# Patient Record
Sex: Male | Born: 1980 | Race: Black or African American | Hispanic: No | Marital: Married | Smoking: Current every day smoker
Health system: Southern US, Community
[De-identification: ages and names within clinical notes are randomized; demographics above are authoritative.]

## PROBLEM LIST (undated history)

## (undated) HISTORY — PX: APPENDECTOMY: SHX54

## (undated) HISTORY — PX: HERNIA REPAIR: SHX51

---

## 2000-09-18 ENCOUNTER — Emergency Department (HOSPITAL_COMMUNITY): Admission: EM | Admit: 2000-09-18 | Discharge: 2000-09-18 | Payer: Self-pay | Admitting: Emergency Medicine

## 2002-03-09 ENCOUNTER — Emergency Department (HOSPITAL_COMMUNITY): Admission: EM | Admit: 2002-03-09 | Discharge: 2002-03-09 | Payer: Self-pay | Admitting: Emergency Medicine

## 2002-08-15 ENCOUNTER — Emergency Department (HOSPITAL_COMMUNITY): Admission: EM | Admit: 2002-08-15 | Discharge: 2002-08-16 | Payer: Self-pay | Admitting: Emergency Medicine

## 2004-07-26 ENCOUNTER — Emergency Department (HOSPITAL_COMMUNITY): Admission: EM | Admit: 2004-07-26 | Discharge: 2004-07-26 | Payer: Self-pay | Admitting: Family Medicine

## 2004-11-01 ENCOUNTER — Emergency Department (HOSPITAL_COMMUNITY): Admission: EM | Admit: 2004-11-01 | Discharge: 2004-11-01 | Payer: Self-pay | Admitting: Emergency Medicine

## 2005-11-12 ENCOUNTER — Emergency Department (HOSPITAL_COMMUNITY): Admission: EM | Admit: 2005-11-12 | Discharge: 2005-11-12 | Payer: Self-pay | Admitting: *Deleted

## 2006-07-16 ENCOUNTER — Ambulatory Visit: Payer: Self-pay | Admitting: Internal Medicine

## 2006-08-01 ENCOUNTER — Ambulatory Visit (HOSPITAL_BASED_OUTPATIENT_CLINIC_OR_DEPARTMENT_OTHER): Admission: RE | Admit: 2006-08-01 | Discharge: 2006-08-01 | Payer: Self-pay | Admitting: General Surgery

## 2006-08-01 ENCOUNTER — Encounter (INDEPENDENT_AMBULATORY_CARE_PROVIDER_SITE_OTHER): Payer: Self-pay | Admitting: Specialist

## 2007-03-08 DIAGNOSIS — K4090 Unilateral inguinal hernia, without obstruction or gangrene, not specified as recurrent: Secondary | ICD-10-CM | POA: Insufficient documentation

## 2007-11-14 ENCOUNTER — Emergency Department (HOSPITAL_COMMUNITY): Admission: EM | Admit: 2007-11-14 | Discharge: 2007-11-15 | Payer: Self-pay | Admitting: Emergency Medicine

## 2009-09-20 ENCOUNTER — Emergency Department (HOSPITAL_COMMUNITY): Admission: EM | Admit: 2009-09-20 | Discharge: 2009-09-20 | Payer: Self-pay | Admitting: Emergency Medicine

## 2009-10-08 ENCOUNTER — Emergency Department (HOSPITAL_COMMUNITY): Admission: EM | Admit: 2009-10-08 | Discharge: 2009-10-08 | Payer: Self-pay | Admitting: Emergency Medicine

## 2010-06-25 ENCOUNTER — Inpatient Hospital Stay (HOSPITAL_COMMUNITY): Admission: EM | Admit: 2010-06-25 | Discharge: 2010-06-28 | Payer: Self-pay | Source: Home / Self Care

## 2010-06-25 ENCOUNTER — Encounter (INDEPENDENT_AMBULATORY_CARE_PROVIDER_SITE_OTHER): Payer: Self-pay

## 2010-09-05 LAB — DIFFERENTIAL
Basophils Absolute: 0 10*3/uL (ref 0.0–0.1)
Basophils Relative: 0 % (ref 0–1)
Eosinophils Absolute: 0 10*3/uL (ref 0.0–0.7)
Eosinophils Relative: 0 % (ref 0–5)

## 2010-09-05 LAB — URINALYSIS, ROUTINE W REFLEX MICROSCOPIC
Glucose, UA: NEGATIVE mg/dL
Hgb urine dipstick: NEGATIVE
Nitrite: NEGATIVE

## 2010-09-05 LAB — CBC
HCT: 41.3 % (ref 39.0–52.0)
Hemoglobin: 12.9 g/dL — ABNORMAL LOW (ref 13.0–17.0)
Hemoglobin: 13.8 g/dL (ref 13.0–17.0)
Hemoglobin: 14.8 g/dL (ref 13.0–17.0)
MCH: 30 pg (ref 26.0–34.0)
MCH: 30.1 pg (ref 26.0–34.0)
MCHC: 33.4 g/dL (ref 30.0–36.0)
MCV: 89.2 fL (ref 78.0–100.0)
MCV: 89.6 fL (ref 78.0–100.0)
MCV: 90.2 fL (ref 78.0–100.0)
Platelets: 225 10*3/uL (ref 150–400)
Platelets: 228 10*3/uL (ref 150–400)
Platelets: 249 10*3/uL (ref 150–400)
Platelets: 250 10*3/uL (ref 150–400)
RBC: 4.58 MIL/uL (ref 4.22–5.81)
RBC: 4.93 MIL/uL (ref 4.22–5.81)
RDW: 14.2 % (ref 11.5–15.5)
RDW: 14.6 % (ref 11.5–15.5)

## 2010-09-05 LAB — COMPREHENSIVE METABOLIC PANEL
ALT: 26 U/L (ref 0–53)
AST: 26 U/L (ref 0–37)
Albumin: 3.4 g/dL — ABNORMAL LOW (ref 3.5–5.2)
Albumin: 3.9 g/dL (ref 3.5–5.2)
Alkaline Phosphatase: 85 U/L (ref 39–117)
BUN: 7 mg/dL (ref 6–23)
Calcium: 8.6 mg/dL (ref 8.4–10.5)
Chloride: 104 mEq/L (ref 96–112)
Creatinine, Ser: 1.04 mg/dL (ref 0.4–1.5)
GFR calc Af Amer: >60 mL/min (ref >60)
GFR calc non Af Amer: >60 mL/min (ref >60)
GFR calc non Af Amer: >60 mL/min (ref >60)
Total Bilirubin: 0.6 mg/dL (ref 0.3–1.2)
Total Protein: 6.6 g/dL (ref 6.0–8.3)
Total Protein: 7.5 g/dL (ref 6.0–8.3)

## 2010-09-05 LAB — LIPASE, BLOOD: Lipase: 18 U/L (ref 11–59)

## 2010-09-18 LAB — ACETAMINOPHEN LEVEL: Acetaminophen (Tylenol), Serum: <10 ug/mL — ABNORMAL LOW (ref 10–30)

## 2010-11-11 NOTE — Op Note (Signed)
NAME:  Ross Powers, Ross Powers             ACCOUNT NO.:  [id number]   MEDICAL RECORD NO.:  [id number]          PATIENT TYPE:  AMB   LOCATION:  [facility]                         FACILITY:  [facility]   PHYSICIAN:  Anselm Pancoast. Weatherly, M.D.DATE OF BIRTH:  [birth year]   DATE OF PROCEDURE:  08/01/2006  DATE OF DISCHARGE:                               OPERATIVE REPORT   PREOPERATIVE DIAGNOSIS:  Large indirect left inguinal hernia, scrotal.   OPERATION:  Left inguinal herniorrhaphy with removal of very large  segment of omentum.   ANESTHESIA:  General.   SURGEON:  Anselm Pancoast. Zachery Dakins, M.D.   ASSISTANT:  Nurse.   HISTORY:  Ross Powers is a 30 year old Caucasian male who is  referred to [place] by [facility] for a quite large symptomatic left  inguinal hernia.  The patient states that he has had a hernia for 7-8  years or so.  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  The patient does smoke cigarettes.  He has not lost any  weight.  Preoperatively took a laxative to clean his colon.  He has had  difficulty completely reducing the hernia, you can decrease it in size  but not completely reduce it.  Whether it is a slider or whether it is  omentum, I could not tell preoperatively.  His i-STAT was unremarkable,  and he was given 1 gm of Ancef preoperatively.  The patient was taken to  the operative suite.  Induction of general anesthesia.  The left groin  area was first clipped and then prepped with Betadine surgical solution  and draped in a sterile manner.  I anesthetized the anterior of the  iliac crest at the ilioinguinal nerve with about 10 cc of 0.5% Marcaine  with Adrenaline and then as we were repairing the hernia, anesthetized  the other area, it is now total about 25-26  cc of the solution used.  Sharp dissection down through the skin and subcutaneous tissue.  The  external oblique was identified.  There were two large veins that were  divided after clamping and ligating with fine Vicryl.  Then the external  oblique aponeurosis was opened up.  The ilioinguinal nerve was  protected.  First, you could not reduce the actual hernia, so I  dissected so I could get my finger completely around it and then opened  the actual hernia sac, and there was just a wad of omentum that was  enormous that really could not be reduced back into the intraperitoneal  cavity, and I just amputated this with the little pedicles tied with 2-0  Vicryl.  The hernia sac then I could separate it from the cord  structures and after carefully dissecting the hernia sac  circumferentially, a high sac ligation of 0 Surgilon was performed, and  a second tie was placed distal to the first.  I anesthetized this where  the high  sac ligation was and then divided the hernia sac.  The now  enormous hernia sac down into the scrotum was carefully separated, not  injuring the blood supply, vas or testicle.  I left a little bit of the  hernia sac right on the actual testicle, but the most generous large  portion of the sac was removed.  Next, the testicle was kind of pushed  back into its normal anatomic position.  The floor, which had been  completely destroyed because of this large hernia, was closed with a  running 2-0 Prolene, reconstructing the internal ring and going back and  tying the two elements together.  Next, a piece of Prolene mesh shaped  like a sail slit laterally was used to reinforce the floor.  The  inferior limb was sutured to the inguinal ligament with a running 2-0  Prolene, and the two tails were sutured together laterally, reinforcing  the internal ring.  The ilioinguinal nerve was with cord structures.  The iliohypogastric part of the mesh goes over this slightly, but I took   care in suturing with interrupted sutures this superior portion, that it  was not putting direct pressure over this nerve.  Next, the external  oblique was closed with 2-0 Vicryl.  I had put Marcaine where the floor  was reinforced, and I anesthetized the subcutaneous tissue and etc. with  the Marcaine, and then a few 3-0 Vicryl sutures in Scarpa's fascia and a  4-0 Vicryl subcuticular and Benzoin and Steri-Strips in the skin.  The  testicle was in its normal position at the completion of the surgery,  and he will be released after a short stay in the recovery room.  I want  him to be on liquids today.  He will be wearing sort of a jockey-type  underwear to kind of support the scrotum to hopefully not have too much  swelling and be off his feet for 2-3 days.           ______________________________  Anselm Pancoast. Zachery Dakins, M.D.     WJW/MEDQ  D:  08/01/2006  T:  08/01/2006  Job:  [id number]

## 2010-12-19 IMAGING — CR DG NECK SOFT TISSUE
1 series · 1 of 1 positions shown · non-contrast
Comparison: None.

CLINICAL DATA: Ingested foreign body (pork chop bone)

NECK SOFT TISSUES - 1+ VIEW

[w soft tissue neck]
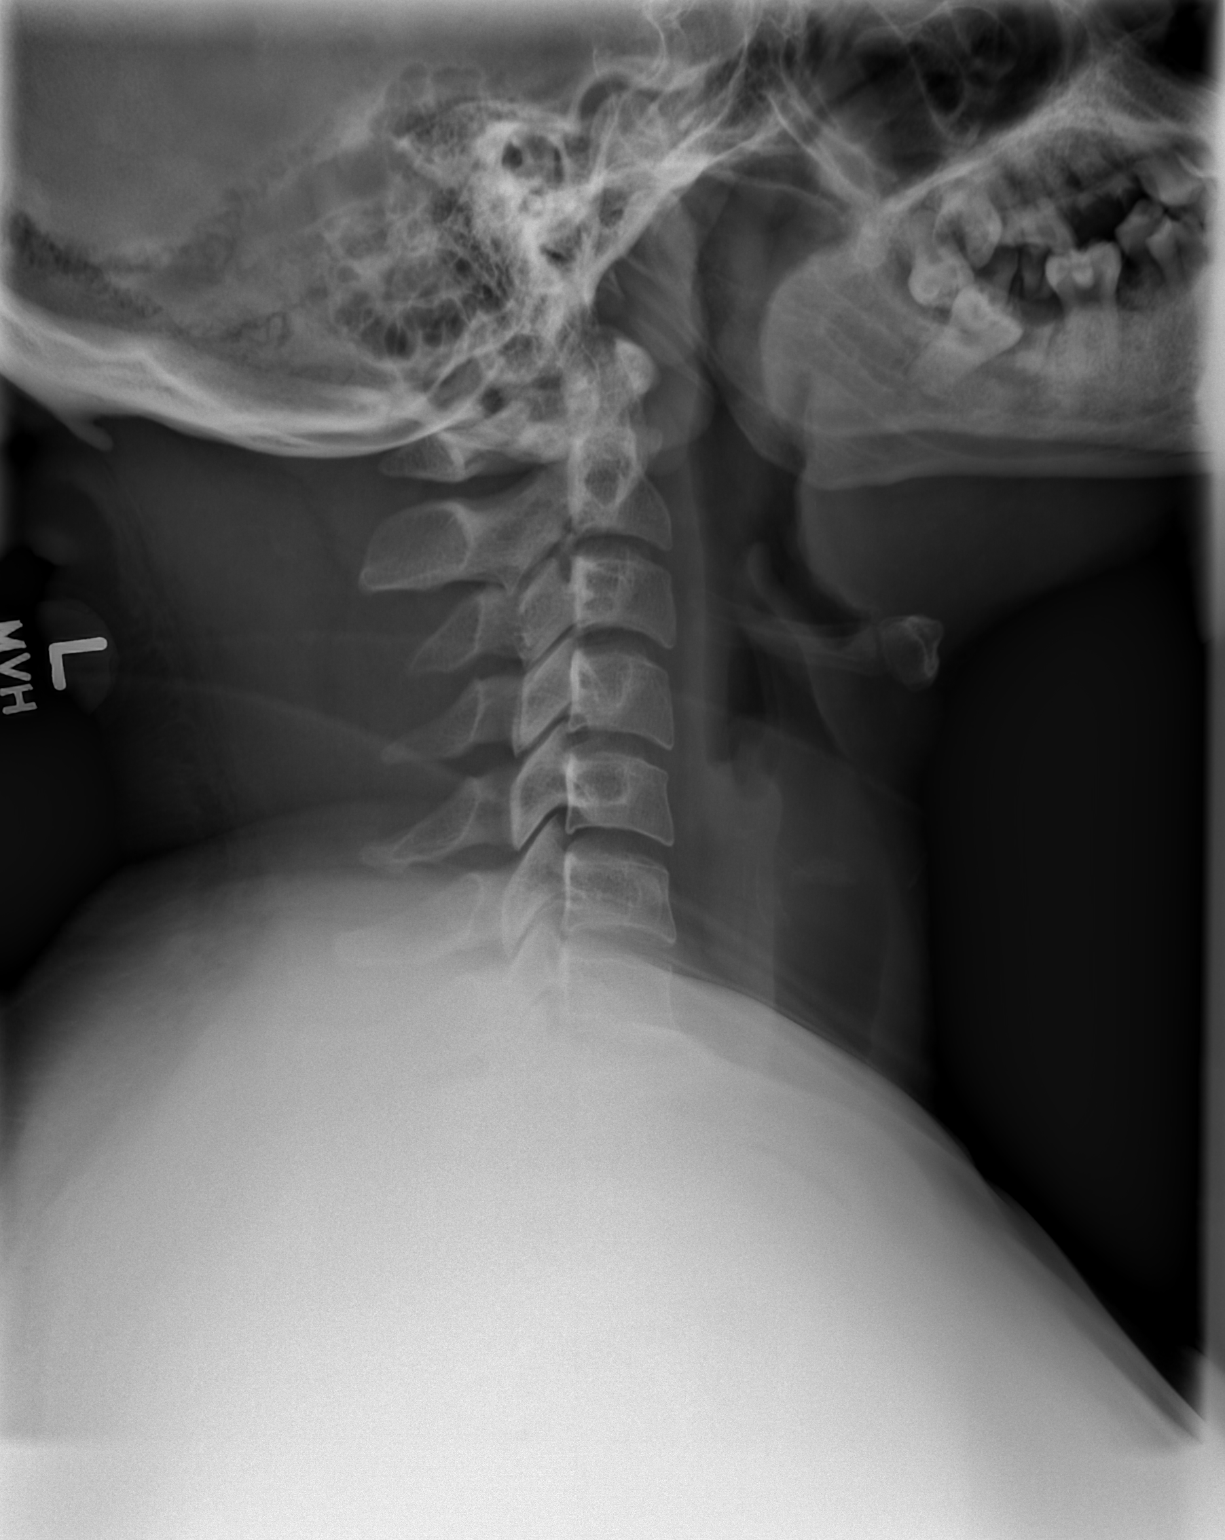

[1 of 1 positions shown; findings below may reference images not displayed]

FINDINGS: No radiopaque foreign body.  Soft tissues are
unremarkable.  The airway appears patent. No precervical soft
tissue widening is present.
IMPRESSION: No radiopaque foreign body.

## 2013-12-11 ENCOUNTER — Encounter (HOSPITAL_COMMUNITY): Payer: Self-pay | Admitting: Emergency Medicine

## 2013-12-11 ENCOUNTER — Inpatient Hospital Stay (HOSPITAL_COMMUNITY)
Admission: EM | Admit: 2013-12-11 | Discharge: 2013-12-14 | DRG: 392 | Disposition: A | Discharge status: Home / Self Care | Payer: Self-pay | Attending: Family Medicine | Admitting: Family Medicine

## 2013-12-11 DIAGNOSIS — F17200 Nicotine dependence, unspecified, uncomplicated: Secondary | ICD-10-CM

## 2013-12-11 DIAGNOSIS — K5732 Diverticulitis of large intestine without perforation or abscess without bleeding: Principal | ICD-10-CM | POA: Diagnosis present

## 2013-12-11 DIAGNOSIS — K5720 Diverticulitis of large intestine with perforation and abscess without bleeding: Secondary | ICD-10-CM

## 2013-12-11 DIAGNOSIS — K5792 Diverticulitis of intestine, part unspecified, without perforation or abscess without bleeding: Secondary | ICD-10-CM

## 2013-12-11 DIAGNOSIS — K4090 Unilateral inguinal hernia, without obstruction or gangrene, not specified as recurrent: Secondary | ICD-10-CM

## 2013-12-11 NOTE — ED Notes (Signed)
Patient is alert and oriented x3.  He is complaining of upper abdominal pain that started yesterday. Currently he rates his pain 9 of 10 with nausea.  He states that the pain is located in the upper  Middle area of his abdomen.

## 2013-12-12 ENCOUNTER — Encounter (HOSPITAL_COMMUNITY): Payer: Self-pay

## 2013-12-12 ENCOUNTER — Emergency Department (HOSPITAL_COMMUNITY): Payer: Self-pay

## 2013-12-12 DIAGNOSIS — K5732 Diverticulitis of large intestine without perforation or abscess without bleeding: Principal | ICD-10-CM

## 2013-12-12 DIAGNOSIS — K631 Perforation of intestine (nontraumatic): Secondary | ICD-10-CM

## 2013-12-12 DIAGNOSIS — F17200 Nicotine dependence, unspecified, uncomplicated: Secondary | ICD-10-CM | POA: Diagnosis present

## 2013-12-12 DIAGNOSIS — K5792 Diverticulitis of intestine, part unspecified, without perforation or abscess without bleeding: Secondary | ICD-10-CM | POA: Diagnosis present

## 2013-12-12 LAB — COMPREHENSIVE METABOLIC PANEL
ALBUMIN: 3.4 g/dL — AB (ref 3.5–5.2)
ALK PHOS: 80 U/L (ref 39–117)
ALT: 31 U/L (ref 0–53)
AST: 29 U/L (ref 0–37)
BILIRUBIN TOTAL: 0.5 mg/dL (ref 0.3–1.2)
BUN: 8 mg/dL (ref 6–23)
CHLORIDE: 100 meq/L (ref 96–112)
CO2: 20 mEq/L (ref 19–32)
CREATININE: 1.3 mg/dL (ref 0.50–1.35)
Calcium: 8.7 mg/dL (ref 8.4–10.5)
GFR calc non Af Amer: 71 mL/min — ABNORMAL LOW (ref >90)
GFR, EST AFRICAN AMERICAN: 82 mL/min — AB (ref >90)
GLUCOSE: 106 mg/dL — AB (ref 70–99)
POTASSIUM: 3.4 meq/L — AB (ref 3.7–5.3)
SODIUM: 134 meq/L — AB (ref 137–147)
Total Protein: 6.7 g/dL (ref 6.0–8.3)

## 2013-12-12 LAB — CBC WITH DIFFERENTIAL/PLATELET
Basophils Absolute: 0 10*3/uL (ref 0.0–0.1)
Basophils Relative: 0 % (ref 0–1)
Eosinophils Absolute: 0 10*3/uL (ref 0.0–0.7)
Eosinophils Relative: 0 % (ref 0–5)
HCT: 43 % (ref 39.0–52.0)
Hemoglobin: 14.7 g/dL (ref 13.0–17.0)
LYMPHS ABS: 0.6 10*3/uL — AB (ref 0.7–4.0)
Lymphocytes Relative: 3 % — ABNORMAL LOW (ref 12–46)
MCH: 29.6 pg (ref 26.0–34.0)
MCHC: 34.2 g/dL (ref 30.0–36.0)
MCV: 86.7 fL (ref 78.0–100.0)
Monocytes Absolute: 0.3 10*3/uL (ref 0.1–1.0)
Monocytes Relative: 2 % — ABNORMAL LOW (ref 3–12)
NEUTROS ABS: 18.7 10*3/uL — AB (ref 1.7–7.7)
NEUTROS PCT: 95 % — AB (ref 43–77)
Platelets: 209 10*3/uL (ref 150–400)
RBC: 4.96 MIL/uL (ref 4.22–5.81)
RDW: 14.8 % (ref 11.5–15.5)
WBC: 19.6 10*3/uL — AB (ref 4.0–10.5)

## 2013-12-12 LAB — LIPASE, BLOOD: LIPASE: 15 U/L (ref 11–59)

## 2013-12-12 MED ORDER — PANTOPRAZOLE SODIUM 40 MG PO TBEC
40.0000 mg | DELAYED_RELEASE_TABLET | Freq: Every day | ORAL | Status: DC
  Administered 2013-12-12 – 2013-12-14 (×3): 40 mg via ORAL
  Filled 2013-12-12 (×3): qty 1

## 2013-12-12 MED ORDER — METRONIDAZOLE IN NACL 5-0.79 MG/ML-% IV SOLN
500.0000 mg | Freq: Once | INTRAVENOUS | Status: AC
  Administered 2013-12-12: 500 mg via INTRAVENOUS
  Filled 2013-12-12: qty 100

## 2013-12-12 MED ORDER — PNEUMOCOCCAL VAC POLYVALENT 25 MCG/0.5ML IJ INJ
0.5000 mL | INJECTION | INTRAMUSCULAR | Status: AC
  Administered 2013-12-13: 0.5 mL via INTRAMUSCULAR
  Filled 2013-12-12 (×2): qty 0.5

## 2013-12-12 MED ORDER — ACETAMINOPHEN 325 MG PO TABS
650.0000 mg | ORAL_TABLET | Freq: Four times a day (QID) | ORAL | Status: DC | PRN
  Administered 2013-12-12: 650 mg via ORAL
  Filled 2013-12-12 (×3): qty 2

## 2013-12-12 MED ORDER — SODIUM CHLORIDE 0.9 % IV SOLN
INTRAVENOUS | Status: DC
  Administered 2013-12-12 – 2013-12-14 (×2): via INTRAVENOUS

## 2013-12-12 MED ORDER — ENOXAPARIN SODIUM 40 MG/0.4ML ~~LOC~~ SOLN
40.0000 mg | SUBCUTANEOUS | Status: DC
  Administered 2013-12-12 – 2013-12-14 (×3): 40 mg via SUBCUTANEOUS
  Filled 2013-12-12 (×3): qty 0.4

## 2013-12-12 MED ORDER — SODIUM CHLORIDE 0.9 % IV SOLN
INTRAVENOUS | Status: DC
  Administered 2013-12-12: 01:00:00 via INTRAVENOUS

## 2013-12-12 MED ORDER — CIPROFLOXACIN IN D5W 400 MG/200ML IV SOLN
400.0000 mg | Freq: Once | INTRAVENOUS | Status: AC
  Administered 2013-12-12: 400 mg via INTRAVENOUS
  Filled 2013-12-12: qty 200

## 2013-12-12 MED ORDER — METRONIDAZOLE IN NACL 5-0.79 MG/ML-% IV SOLN
500.0000 mg | Freq: Three times a day (TID) | INTRAVENOUS | Status: DC
  Administered 2013-12-12 – 2013-12-14 (×6): 500 mg via INTRAVENOUS
  Filled 2013-12-12 (×7): qty 100

## 2013-12-12 MED ORDER — CIPROFLOXACIN IN D5W 400 MG/200ML IV SOLN
400.0000 mg | Freq: Two times a day (BID) | INTRAVENOUS | Status: DC
  Administered 2013-12-12 – 2013-12-14 (×4): 400 mg via INTRAVENOUS
  Filled 2013-12-12 (×5): qty 200

## 2013-12-12 MED ORDER — NICOTINE 7 MG/24HR TD PT24
7.0000 mg | MEDICATED_PATCH | Freq: Every day | TRANSDERMAL | Status: DC
  Administered 2013-12-12 – 2013-12-14 (×3): 7 mg via TRANSDERMAL
  Filled 2013-12-12 (×3): qty 1

## 2013-12-12 MED ORDER — ONDANSETRON HCL 4 MG PO TABS
4.0000 mg | ORAL_TABLET | Freq: Four times a day (QID) | ORAL | Status: DC | PRN
  Administered 2013-12-14: 4 mg via ORAL
  Filled 2013-12-12: qty 1

## 2013-12-12 MED ORDER — CIPROFLOXACIN IN D5W 400 MG/200ML IV SOLN: 400.0000 mg | Freq: Once | INTRAVENOUS | Status: DC

## 2013-12-12 MED ORDER — IOHEXOL 300 MG/ML  SOLN
50.0000 mL | Freq: Once | INTRAMUSCULAR | Status: AC | PRN
  Administered 2013-12-12: 50 mL via ORAL

## 2013-12-12 MED ORDER — METRONIDAZOLE IN NACL 5-0.79 MG/ML-% IV SOLN: 500.0000 mg | Freq: Once | INTRAVENOUS | Status: DC

## 2013-12-12 MED ORDER — ONDANSETRON HCL 4 MG/2ML IJ SOLN
4.0000 mg | Freq: Once | INTRAMUSCULAR | Status: AC
  Administered 2013-12-12: 4 mg via INTRAVENOUS
  Filled 2013-12-12: qty 2

## 2013-12-12 MED ORDER — MORPHINE SULFATE 2 MG/ML IJ SOLN
2.0000 mg | INTRAMUSCULAR | Status: DC | PRN
  Administered 2013-12-12 – 2013-12-14 (×3): 2 mg via INTRAVENOUS
  Filled 2013-12-12 (×3): qty 1

## 2013-12-12 MED ORDER — ONDANSETRON HCL 4 MG/2ML IJ SOLN
4.0000 mg | Freq: Four times a day (QID) | INTRAMUSCULAR | Status: DC | PRN
  Administered 2013-12-12 – 2013-12-14 (×4): 4 mg via INTRAVENOUS
  Filled 2013-12-12 (×4): qty 2

## 2013-12-12 MED ORDER — FENTANYL CITRATE 0.05 MG/ML IJ SOLN
50.0000 ug | Freq: Once | INTRAMUSCULAR | Status: AC
  Administered 2013-12-12: 50 ug via INTRAVENOUS
  Filled 2013-12-12: qty 2

## 2013-12-12 MED ORDER — IOHEXOL 300 MG/ML  SOLN
100.0000 mL | Freq: Once | INTRAMUSCULAR | Status: AC | PRN
  Administered 2013-12-12: 100 mL via INTRAVENOUS

## 2013-12-12 NOTE — H&P (Signed)
[facility] History and Physical  Patient: Ross Powers  ONG:[id number]  DOB: 1981/05/12  DOS: the patient was seen and examined on 12/12/2013 PCP: No primary provider on file.  Chief Complaint: Abdominal pain  HPI: Ross Powers is a [age] y.o. male with Past medical history of smoking cigarettes. Patient presented with complaints of abdominal pain. He mentions the pain started the day before yesterday and yesterday it become severe. The pain was located diffusely in his stomach and was sharp continuous pain. Pain at the time of my evaluation has improved significantly. This was followed by nausea and vomiting. He also had a few episode of diarrhea without any blood. He mentions it first same as his prior appendicites. He denies any alcohol abuse. Denies any similar pain recently. He denies any fever or chills at home other than today. He denies any issues with urination. He denies any chest pain shortness of breath.  The patient is coming from home. And at his baseline independent for most of his ADL.  Review of Systems: as mentioned in the history of present illness.  A Comprehensive review of the other systems is negative.  History reviewed. No pertinent past medical history. Past Surgical History  Procedure Laterality Date  . Hernia repair    . Appendectomy     Social History:  reports that he has been smoking.  He does not have any smokeless tobacco history on file. He reports that he does not drink alcohol. His drug history is not on file.  No Known Allergies  No family history on file.  Prior to Admission medications   Medication Sig Start Date End Date Taking? Authorizing Provider  ranitidine (ZANTAC) 150 MG tablet Take 150 mg by mouth 2 (two) times daily as needed for heartburn.   Yes Historical Provider, MD    Physical Exam: Filed Vitals:   12/11/13 2302 12/12/13 0415  BP: 188/70 104/54  Pulse: 100 86  Temp: 100.2 F (37.9 C) 99.2 F (37.3 C)   TempSrc: Oral Oral  Resp: 16 16  SpO2: 99% 97%    General: Alert, Awake and Oriented to Time, Place and Person. Appear in mild distress Eyes: PERRL ENT: Oral Mucosa clear moist. Neck: no JVD Cardiovascular: S1 and S2 Present, no Murmur, Peripheral Pulses Present Respiratory: Bilateral Air entry equal and Decreased, Clear to Auscultation,  no Crackles,no wheezes Abdomen: Bowel Sound Present, Soft and diffuse mild  tender Skin: no Rash Extremities: no Pedal edema, no calf tenderness Neurologic: Grossly no focal neuro deficit. Labs on Admission:  CBC:  Recent Labs Lab 12/12/13 0004  WBC 19.6*  NEUTROABS 18.7*  HGB 14.7  HCT 43.0  MCV 86.7  PLT 209    CMP     Component Value Date/Time   NA 134* 12/12/2013 0004   K 3.4* 12/12/2013 0004   CL 100 12/12/2013 0004   CO2 20 12/12/2013 0004   GLUCOSE 106* 12/12/2013 0004   BUN 8 12/12/2013 0004   CREATININE 1.30 12/12/2013 0004   CALCIUM 8.7 12/12/2013 0004   PROT 6.7 12/12/2013 0004   ALBUMIN 3.4* 12/12/2013 0004   AST 29 12/12/2013 0004   ALT 31 12/12/2013 0004   ALKPHOS 80 12/12/2013 0004   BILITOT 0.5 12/12/2013 0004   GFRNONAA 71* 12/12/2013 0004   GFRAA 82* 12/12/2013 0004     Recent Labs Lab 12/12/13 0004  LIPASE 15   No results found for this basename: AMMONIA,  in the last 168 hours  No results found  for this basename: CKTOTAL, CKMB, CKMBINDEX, TROPONINI,  in the last 168 hours BNP (last 3 results) No results found for this basename: PROBNP,  in the last 8760 hours  Radiological Exams on Admission: No results found.  Assessment/Plan Principal Problem:   Acute diverticulitis Active Problems:   Smoker   1. Acute diverticulitis Patient presents with complaints of abdominal pain. CT of the abdomen is showing evidence of diverticulitis. It measures no perforation. Patient complains of severe pain at present with nausea and vomiting. And is unable to tolerate by mouth. Patient is admitted to the hospital. Would  continue him with IV fluids. IV antibiotics Cipro and Flagyl. IV morphine for pain. IV Zofran for nausea. Protonix.  2.Smoking. Patient was counseled to quit smoking nicotine patch of ordered.  Consults: General surgery  DVT Prophylaxis: subcutaneous Heparin Nutrition: N.p.o. except medication  Code Status: Full  Family Communication: Significant other  was present at bedside, opportunity was given to ask question and all questions were answered satisfactorily at the time of interview. Disposition: Admitted to inpatient in med-surge unit.  Author: Lynden OxfordPranav Patel, MD [facility] Pager: [phone] 12/12/2013, 6:20 AM    If 7PM-7AM, please contact night-coverage [email or web address] Password [email or web address]

## 2013-12-12 NOTE — ED Notes (Signed)
Attempted to call report to 3W RN, RN cannot take report at this time, said will call back.

## 2013-12-12 NOTE — ED Provider Notes (Signed)
CSN: [id number]     Arrival date & time 12/11/13  2242 History   First MD Initiated Contact with Patient 12/12/13 0023     Chief Complaint  Patient presents with  . Abdominal Pain     (Consider location/radiation/quality/duration/timing/severity/associated sxs/prior Treatment) HPI 33 year old male presents to emergency room with complaint of abdominal pain.  Pain started Wednesday.  Pain is in the epigastrium with some radiation into his back.  Patient has had nausea vomiting and diarrhea.  He reports subjective fevers at home.  Patient status post appendectomy and inguinal hernia repair and there are past.  No sick contacts, no unusual foods, no travel. History reviewed. No pertinent past medical history. Past Surgical History  Procedure Laterality Date  . Hernia repair    . Appendectomy     No family history on file. History  Substance Use Topics  . Smoking status: Current Every Day Smoker  . Smokeless tobacco: Not on file  . Alcohol Use: No    Review of Systems   See History of Present Illness; otherwise all other systems are reviewed and negative  Allergies  Review of patient's allergies indicates no known allergies.  Home Medications   Prior to Admission medications   Medication Sig Start Date End Date Taking? Authorizing Provider  ranitidine (ZANTAC) 150 MG tablet Take 150 mg by mouth 2 (two) times daily as needed for heartburn.   Yes Historical Provider, MD   BP 188/70  Pulse 100  Temp(Src) 100.2 F (37.9 C) (Oral)  Resp 16  SpO2 99% Physical Exam  Nursing note and vitals reviewed. Constitutional: He is oriented to person, place, and time. He appears well-developed and well-nourished. He appears distressed (uncomfortable appearing).  HENT:  Head: Normocephalic and atraumatic.  Right Ear: External ear normal.  Left Ear: External ear normal.  Nose: Nose normal.  Mouth/Throat: Oropharynx is clear and moist.  Eyes: Conjunctivae and EOM are normal. Pupils are  equal, round, and reactive to light.  Neck: Normal range of motion. Neck supple. No JVD present. No tracheal deviation present. No thyromegaly present.  Cardiovascular: Normal rate, regular rhythm, normal heart sounds and intact distal pulses.  Exam reveals no gallop and no friction rub.   No murmur heard. Pulmonary/Chest: Effort normal and breath sounds normal. No stridor. No respiratory distress. He has no wheezes. He has no rales. He exhibits no tenderness.  Abdominal: Soft. Bowel sounds are normal. He exhibits no distension and no mass. There is tenderness (tenderness in epigastrium, also tenderness in lower abdomen and suprapubic and right lower quadrant). There is no rebound and no guarding.  Musculoskeletal: Normal range of motion. He exhibits no edema and no tenderness.  Lymphadenopathy:    He has no cervical adenopathy.  Neurological: He is alert and oriented to person, place, and time. He exhibits normal muscle tone. Coordination normal.  Skin: Skin is warm and dry. No rash noted. No erythema. No pallor.  Psychiatric: He has a normal mood and affect. His behavior is normal. Judgment and thought content normal.    ED Course  Procedures (including critical care time) Labs Review Labs Reviewed  CBC WITH DIFFERENTIAL - Abnormal; Notable for the following:    WBC 19.6 (*)    Neutrophils Relative % 95 (*)    Neutro Abs 18.7 (*)    Lymphocytes Relative 3 (*)    Lymphs Abs 0.6 (*)    Monocytes Relative 2 (*)    All other components within normal limits  COMPREHENSIVE METABOLIC PANEL -  Abnormal; Notable for the following:    Sodium 134 (*)    Potassium 3.4 (*)    Glucose, Bld 106 (*)    Albumin 3.4 (*)    GFR calc non Af Amer 71 (*)    GFR calc Af Amer 82 (*)    All other components within normal limits  LIPASE, BLOOD    Imaging Review No results found.   EKG Interpretation   Date/Time:  Thursday December 11 2013 22:53:12 EDT Ventricular Rate:  103 PR Interval:  146 QRS  Duration: 80 QT Interval:  305 QTC Calculation: 399 R Axis:   29 Text Interpretation:  Sinus tachycardia Consider right atrial enlargement  Baseline wander in lead(s) V2 No acute changes Confirmed by Rhunette CroftNANAVATI, MD,  Janey GentaANKIT [phone]) on 12/11/2013 10:59:14 PM      MDM   Final diagnoses:  Diverticulitis of large intestine with perforation without bleeding    33 year old male with 2 and half days of abdominal pain, nausea vomiting and diarrhea.  Patient has low-grade fever here.  Concern for colitis, enteritis, cholecystitis although for my exam, patient is more tender in the lower abdomen.  Plan for labs, pain and nausea medicine, and CT scan.  4:07 AM There is a delay in getting CT scan read crossed over.  Discussed with the radiologist he reports patient has diverticulitis with thickening of the walls of the sigmoid colon, and slightly increased air in the diverticula than expected, possible micro-perfect.  Patient started on Cipro and Flagyl.  Will discuss with hospitalist for admission.  Olivia Mackielga M Otter, MD 12/12/13 [phone]

## 2013-12-12 NOTE — Progress Notes (Signed)
ANTIBIOTIC CONSULT NOTE - INITIAL  Pharmacy Consult for Cipro Indication: Intra-abdominal infection  No Known Allergies  Patient Measurements: Height: 5\' 6"  (167.6 cm) Weight: 218 lb 1.6 oz (98.93 kg) IBW/kg (Calculated) : 63.8   Vital Signs: Temp: 99.9 F (37.7 C) (06/19 0625) Temp src: Oral (06/19 0625) BP: 134/71 mmHg (06/19 0625) Pulse Rate: 80 (06/19 0625) Intake/Output from previous day:   Intake/Output from this shift:    Labs:  Recent Labs  12/12/13 0004  WBC 19.6*  HGB 14.7  PLT 209  CREATININE 1.30   Estimated Creatinine Clearance: 88.9 ml/min (by C-G formula based on Cr of 1.3). No results found for this basename: VANCOTROUGH, VANCOPEAK, VANCORANDOM, GENTTROUGH, GENTPEAK, GENTRANDOM, TOBRATROUGH, TOBRAPEAK, TOBRARND, AMIKACINPEAK, AMIKACINTROU, AMIKACIN,  in the last 72 hours   Microbiology: No results found for this or any previous visit (from the past 720 hour(s)).  Medical History: History reviewed. No pertinent past medical history.  Medications:  Scheduled:  . enoxaparin (LOVENOX) injection  40 mg Subcutaneous Q24H  . metronidazole  500 mg Intravenous Q8H  . pantoprazole  40 mg Oral Daily   Infusions:  . sodium chloride Stopped (12/12/13 0248)  . sodium chloride     Assessment: 33 yo c/o n/v, diarrhea.  Cipro per Rx and Flagyl per MD for intra-abdominal pain.   Goal of Therapy:  Treat infection  Plan:   Cipro 400mg  IV q12h  F/U SCr/cultures as needed  Ross Powers, Ross Powers 12/12/2013,6:30 AM

## 2013-12-12 NOTE — Care Management Note (Unsigned)
    Page 1 of 1   12/12/2013     2:15:56 PM CARE MANAGEMENT NOTE 12/12/2013  Patient:  Ross Powers,Ross C   Account Number:  [id number]  Date Initiated:  12/12/2013  Documentation initiated by:  [facility] CenterMIRINGU,Promise Bushong  Subjective/Objective Assessment:   33 year old male admitted with diverticulitis.     Action/Plan:   From home.   Anticipated DC Date:  12/15/2013   Anticipated DC Plan:  HOME/SELF CARE      DC Planning Services  CM consult      Choice offered to / List presented to:             Status of service:  In process, will continue to follow Medicare Important Message given?   (If response is "NO", the following Medicare IM given date fields will be blank) Date Medicare IM given:   Date Additional Medicare IM given:    Discharge Disposition:    Per UR Regulation:  Reviewed for med. necessity/level of care/duration of stay  If discussed at Long Length of Stay Meetings, dates discussed:    Comments:

## 2013-12-12 NOTE — Progress Notes (Signed)
Subjective: Patient seen and examined, admitted this morning with acute diverticulitis. See detailed H&P by Dr. Allena KatzPatel. Patient says that he's feeling better Filed Vitals:   12/12/13 0625  BP: 134/71  Pulse: 80  Temp: 99.9 F (37.7 C)  Resp: 16    Chest: Clear Bilaterally Heart : S1S2 RRR Abdomen: Mild tenderness in the left lower quadrant Ext : No edema Neuro: Alert, oriented x 3  A/P Diverticulitis Continue with IV Cipro and Flagyl. Morphine for pain, Zofran for nausea and vomiting.  Tobacco abuse Nicotine patch   Meredeth IdeGagan S Lama [facility] [phone]- [phone]

## 2013-12-13 DIAGNOSIS — F17200 Nicotine dependence, unspecified, uncomplicated: Secondary | ICD-10-CM

## 2013-12-13 LAB — COMPREHENSIVE METABOLIC PANEL
ALT: 37 U/L (ref 0–53)
AST: 36 U/L (ref 0–37)
Albumin: 2.9 g/dL — ABNORMAL LOW (ref 3.5–5.2)
Alkaline Phosphatase: 74 U/L (ref 39–117)
BILIRUBIN TOTAL: 0.4 mg/dL (ref 0.3–1.2)
BUN: 13 mg/dL (ref 6–23)
CALCIUM: 8.3 mg/dL — AB (ref 8.4–10.5)
CO2: 22 mEq/L (ref 19–32)
Chloride: 102 mEq/L (ref 96–112)
Creatinine, Ser: 1.28 mg/dL (ref 0.50–1.35)
GFR, EST AFRICAN AMERICAN: 84 mL/min — AB (ref >90)
GFR, EST NON AFRICAN AMERICAN: 72 mL/min — AB (ref >90)
GLUCOSE: 127 mg/dL — AB (ref 70–99)
Potassium: 3.7 mEq/L (ref 3.7–5.3)
Sodium: 136 mEq/L — ABNORMAL LOW (ref 137–147)
Total Protein: 6 g/dL (ref 6.0–8.3)

## 2013-12-13 LAB — CBC
HCT: 39.8 % (ref 39.0–52.0)
HEMOGLOBIN: 13.9 g/dL (ref 13.0–17.0)
MCH: 30.9 pg (ref 26.0–34.0)
MCHC: 34.9 g/dL (ref 30.0–36.0)
MCV: 88.4 fL (ref 78.0–100.0)
PLATELETS: 175 10*3/uL (ref 150–400)
RBC: 4.5 MIL/uL (ref 4.22–5.81)
RDW: 14.9 % (ref 11.5–15.5)
WBC: 17.3 10*3/uL — ABNORMAL HIGH (ref 4.0–10.5)

## 2013-12-13 LAB — PROTIME-INR
INR: 1.06 (ref 0.00–1.49)
PROTHROMBIN TIME: 13.6 s (ref 11.6–15.2)

## 2013-12-13 NOTE — Progress Notes (Signed)
[facility] PROGRESS NOTE  Ross MeyerWillie C Powers ZOX:[id number] DOB: 07-17-1980 DOA: 12/11/2013 PCP: No primary provider on file.  Assessment/Plan: 1. Acute diverticulitis- CT abdomen showed acute diverticulitis at the mid sigmoid colon, with focal soft tissue inflammation and colonic wall thickening, did not show perforation. Patient started on Cipro and Flagyl, has clinically improved. Pain is much better. Patient is hungry we'll start him on clear liquid diet. If tolerates will advance the diet as tolerated. Continue morphine for pain 2. Nausea vomiting - secondary to above, continue Zofran when necessary 3. Tobacco abuse- continue nicotine patch 4. DVT prophylaxis- Lovenox  Code Status: Full code Family Communication: *Discussed with wife at bedside Disposition Plan: Home when stable   Consultants:  None  Procedures:  None  Antibiotics:  Cipro  Flagyl   HPI/Subjective: Patient seen and examined, admitted with acute sigmoid diverticulitis.  Objective: Filed Vitals:   12/13/13 0503  BP: 132/75  Pulse: 70  Temp: 99.1 F (37.3 C)  Resp: 16    Intake/Output Summary (Last 24 hours) at 12/13/13 1053 Last data filed at 12/13/13 0503  Gross per 24 hour  Intake 1616.66 ml  Output      0 ml  Net 1616.66 ml   Filed Weights   12/12/13 0625  Weight: 98.93 kg (218 lb 1.6 oz)    Exam:  Physical Exam: Head: Normocephalic, atraumatic.        Neck: supple,No deformities, masses, or tenderness noted. Lungs: Normal respiratory effort. B/L Clear to auscultation, no crackles or wheezes.  Heart: Regular RR. S1 and S2 normal  Abdomen: BS normoactive. Soft, Nondistended, Mild tenderness to palpation in the left lower quadrant Extremities: No pretibial edema, no erythema   Data Reviewed: Basic Metabolic Panel:  Recent Labs Lab 12/12/13 0004 12/13/13 0456  NA 134* 136*  K 3.4* 3.7  CL 100 102  CO2 20 22  GLUCOSE 106* 127*  BUN 8 13  CREATININE 1.30 1.28   CALCIUM 8.7 8.3*   Liver Function Tests:  Recent Labs Lab 12/12/13 0004 12/13/13 0456  AST 29 36  ALT 31 37  ALKPHOS 80 74  BILITOT 0.5 0.4  PROT 6.7 6.0  ALBUMIN 3.4* 2.9*    Recent Labs Lab 12/12/13 0004  LIPASE 15   No results found for this basename: AMMONIA,  in the last 168 hours CBC:  Recent Labs Lab 12/12/13 0004 12/13/13 0456  WBC 19.6* 17.3*  NEUTROABS 18.7*  --   HGB 14.7 13.9  HCT 43.0 39.8  MCV 86.7 88.4  PLT 209 175   Cardiac Enzymes: No results found for this basename: CKTOTAL, CKMB, CKMBINDEX, TROPONINI,  in the last 168 hours BNP (last 3 results) No results found for this basename: PROBNP,  in the last 8760 hours CBG: No results found for this basename: GLUCAP,  in the last 168 hours  No results found for this or any previous visit (from the past 240 hour(s)).   Studies: Ct Abdomen Pelvis W Contrast  12/12/2013   CLINICAL DATA:  Upper and mid abdominal pain.  Leukocytosis.  EXAM: CT ABDOMEN AND PELVIS WITH CONTRAST  TECHNIQUE: Multidetector CT imaging of the abdomen and pelvis was performed using the standard protocol following bolus administration of intravenous contrast.  CONTRAST:  100mL OMNIPAQUE IOHEXOL 300 MG/ML  SOLN  COMPARISON:  CT of the abdomen and pelvis performed 06/25/2010  FINDINGS: The visualized lung bases are clear.  The liver and spleen are unremarkable in appearance. The gallbladder is within normal limits. The pancreas  and adrenal glands are unremarkable.  The kidneys are unremarkable in appearance. There is no evidence of hydronephrosis. No renal or ureteral stones are seen. No perinephric stranding is appreciated.  No free fluid is identified. The small bowel is unremarkable in appearance. The stomach is within normal limits. No acute vascular abnormalities are seen.  The patient is status post appendectomy. Minimal scattered diverticulosis is noted along the entirety of the colon.  Focal soft tissue inflammation and colonic wall  thickening is noted at the mid sigmoid colon, with an inflamed diverticulum. Findings are compatible with acute diverticulitis. There is no evidence of significant perforation or abscess formation.  The bladder is mildly distended and grossly unremarkable in appearance. The prostate is normal in size. No inguinal lymphadenopathy is seen.  No acute osseous abnormalities are identified.  IMPRESSION: 1. Acute diverticulitis noted at the mid sigmoid colon, with focal soft tissue inflammation and colonic wall thickening. No evidence of significant perforation or abscess formation. 2. Minimal scattered diverticulosis along the entirety of the colon.   Electronically Signed   By: Roanna RaiderJeffery  Chang M.D.   On: 12/12/2013 03:11    Scheduled Meds: . ciprofloxacin  400 mg Intravenous Q12H  . enoxaparin (LOVENOX) injection  40 mg Subcutaneous Q24H  . metronidazole  500 mg Intravenous Q8H  . nicotine  7 mg Transdermal Daily  . pantoprazole  40 mg Oral Daily   Continuous Infusions: . sodium chloride Stopped (12/12/13 0248)  . sodium chloride 100 mL/hr at 12/12/13 [id number]    Principal Problem:   Acute diverticulitis Active Problems:   Smoker    Time spent: 25 min    [facility], IncAMA,GAGAN S  [facility] Pager [phone] If 7PM-7AM, please contact night-coverage at [email or web address], password [email or web address] 12/13/2013, 10:53 AM  LOS: 2 days

## 2013-12-14 LAB — CBC
HCT: 39.7 % (ref 39.0–52.0)
Hemoglobin: 13.4 g/dL (ref 13.0–17.0)
MCH: 29.2 pg (ref 26.0–34.0)
MCHC: 33.8 g/dL (ref 30.0–36.0)
MCV: 86.5 fL (ref 78.0–100.0)
PLATELETS: 172 10*3/uL (ref 150–400)
RBC: 4.59 MIL/uL (ref 4.22–5.81)
RDW: 14.7 % (ref 11.5–15.5)
WBC: 14.7 10*3/uL — ABNORMAL HIGH (ref 4.0–10.5)

## 2013-12-14 MED ORDER — HYDROCODONE-ACETAMINOPHEN 5-325 MG PO TABS: 1.0000 | ORAL_TABLET | Freq: Four times a day (QID) | ORAL | Status: AC | PRN

## 2013-12-14 MED ORDER — METRONIDAZOLE 500 MG PO TABS: 500.0000 mg | ORAL_TABLET | Freq: Three times a day (TID) | ORAL | Status: AC

## 2013-12-14 MED ORDER — CIPROFLOXACIN HCL 500 MG PO TABS
500.0000 mg | ORAL_TABLET | Freq: Two times a day (BID) | ORAL | Status: DC
  Filled 2013-12-14 (×2): qty 1

## 2013-12-14 MED ORDER — CIPROFLOXACIN HCL 500 MG PO TABS: 500.0000 mg | ORAL_TABLET | Freq: Two times a day (BID) | ORAL | Status: AC

## 2013-12-14 MED ORDER — ONDANSETRON HCL 4 MG PO TABS: 4.0000 mg | ORAL_TABLET | Freq: Three times a day (TID) | ORAL | Status: AC | PRN

## 2013-12-14 MED ORDER — METRONIDAZOLE 500 MG PO TABS
500.0000 mg | ORAL_TABLET | Freq: Three times a day (TID) | ORAL | Status: DC
  Administered 2013-12-14: 500 mg via ORAL
  Filled 2013-12-14 (×3): qty 1

## 2013-12-14 MED ORDER — CIPROFLOXACIN HCL 500 MG PO TABS
500.0000 mg | ORAL_TABLET | Freq: Two times a day (BID) | ORAL | Status: DC
  Filled 2013-12-14 (×3): qty 1

## 2013-12-14 NOTE — Discharge Summary (Signed)
Physician Discharge Summary  Ross Powers ZOX:[id number] DOB: 1980/08/16 DOA: 12/11/2013  PCP: No primary provider on file.  Admit date: 12/11/2013 Discharge date: 12/14/2013  Time spent: 35 minutes  Recommendations for Outpatient Follow-up:  1. Followup PCP in 1 week 2.  Discharge Diagnoses:  Principal Problem:   Acute diverticulitis Active Problems:   Smoker   Discharge Condition: Stable  Diet recommendation: Regular  Filed Weights   12/12/13 0625 12/14/13 0714  Weight: 98.93 kg (218 lb 1.6 oz) 100.29 kg (221 lb 1.6 oz)    History of present illness:  33 y.o. male with Past medical history of smoking cigarettes.  Patient presented with complaints of abdominal pain. He mentions the pain started the day before yesterday and yesterday it become severe. The pain was located diffusely in his stomach and was sharp continuous pain. Pain at the time of my evaluation has improved significantly. This was followed by nausea and vomiting. He also had a few episode of diarrhea without any blood.  He mentions it first same as his prior appendicites.  He denies any alcohol abuse. Denies any similar pain recently. He denies any fever or chills at home other than today. He denies any issues with urination. He denies any chest pain shortness of breath.  The patient is coming from home. And at his baseline independent for most of his ADL.      Hospital Course:  Acute diverticulitis- Improved, wbc is down to 14000, CT abdomen showed acute diverticulitis at the mid sigmoid colon, with focal soft tissue inflammation and colonic wall thickening, did not show perforation. Patient started on Cipro and Flagyl, has clinically improved. Pain is much better. Patient is hungry tolerated  regular diet I Will discharge home on Cipro and Flagyl. Vicodin when necessary for pain  Nausea vomiting - secondary to above, continue Zofran when necessary      Procedures:  None  Consultations:  None  Discharge Exam: Filed Vitals:   12/14/13 1421  BP: 137/85  Pulse: 58  Temp: 98.5 F (36.9 C)  Resp: 16      Discharge Instructions You were cared for by a hospitalist during your hospital stay. If you have any questions about your discharge medications or the care you received while you were in the hospital after you are discharged, you can call the unit and asked to speak with the hospitalist on call if the hospitalist that took care of you is not available. Once you are discharged, your primary care physician will handle any further medical issues. Please note that NO REFILLS for any discharge medications will be authorized once you are discharged, as it is imperative that you return to your primary care physician (or establish a relationship with a primary care physician if you do not have one) for your aftercare needs so that they can reassess your need for medications and monitor your lab values.  Discharge Instructions   Diet - low sodium heart healthy    Complete by:  As directed      Increase activity slowly    Complete by:  As directed             Medication List         ciprofloxacin 500 MG tablet  Commonly known as:  CIPRO  Take 1 tablet (500 mg total) by mouth 2 (two) times daily.     HYDROcodone-acetaminophen 5-325 MG per tablet  Commonly known as:  LORTAB  Take 1 tablet by mouth every 6 (six)  hours as needed for moderate pain.     metroNIDAZOLE 500 MG tablet  Commonly known as:  FLAGYL  Take 1 tablet (500 mg total) by mouth every 8 (eight) hours.     ZANTAC 150 MG tablet  Generic drug:  ranitidine  Take 150 mg by mouth 2 (two) times daily as needed for heartburn.       No Known Allergies    The results of significant diagnostics from this hospitalization (including imaging, microbiology, ancillary and laboratory) are listed below for reference.    Significant Diagnostic Studies: Ct Abdomen  Pelvis W Contrast  12/12/2013   CLINICAL DATA:  Upper and mid abdominal pain.  Leukocytosis.  EXAM: CT ABDOMEN AND PELVIS WITH CONTRAST  TECHNIQUE: Multidetector CT imaging of the abdomen and pelvis was performed using the standard protocol following bolus administration of intravenous contrast.  CONTRAST:  100mL OMNIPAQUE IOHEXOL 300 MG/ML  SOLN  COMPARISON:  CT of the abdomen and pelvis performed 06/25/2010  FINDINGS: The visualized lung bases are clear.  The liver and spleen are unremarkable in appearance. The gallbladder is within normal limits. The pancreas and adrenal glands are unremarkable.  The kidneys are unremarkable in appearance. There is no evidence of hydronephrosis. No renal or ureteral stones are seen. No perinephric stranding is appreciated.  No free fluid is identified. The small bowel is unremarkable in appearance. The stomach is within normal limits. No acute vascular abnormalities are seen.  The patient is status post appendectomy. Minimal scattered diverticulosis is noted along the entirety of the colon.  Focal soft tissue inflammation and colonic wall thickening is noted at the mid sigmoid colon, with an inflamed diverticulum. Findings are compatible with acute diverticulitis. There is no evidence of significant perforation or abscess formation.  The bladder is mildly distended and grossly unremarkable in appearance. The prostate is normal in size. No inguinal lymphadenopathy is seen.  No acute osseous abnormalities are identified.  IMPRESSION: 1. Acute diverticulitis noted at the mid sigmoid colon, with focal soft tissue inflammation and colonic wall thickening. No evidence of significant perforation or abscess formation. 2. Minimal scattered diverticulosis along the entirety of the colon.   Electronically Signed   By: Roanna RaiderJeffery  Chang M.D.   On: 12/12/2013 03:11    Microbiology: No results found for this or any previous visit (from the past 240 hour(s)).   Labs: Basic Metabolic  Panel:  Recent Labs Lab 12/12/13 0004 12/13/13 0456  NA 134* 136*  K 3.4* 3.7  CL 100 102  CO2 20 22  GLUCOSE 106* 127*  BUN 8 13  CREATININE 1.30 1.28  CALCIUM 8.7 8.3*   Liver Function Tests:  Recent Labs Lab 12/12/13 0004 12/13/13 0456  AST 29 36  ALT 31 37  ALKPHOS 80 74  BILITOT 0.5 0.4  PROT 6.7 6.0  ALBUMIN 3.4* 2.9*    Recent Labs Lab 12/12/13 0004  LIPASE 15   No results found for this basename: AMMONIA,  in the last 168 hours CBC:  Recent Labs Lab 12/12/13 0004 12/13/13 0456 12/14/13 0446  WBC 19.6* 17.3* 14.7*  NEUTROABS 18.7*  --   --   HGB 14.7 13.9 13.4  HCT 43.0 39.8 39.7  MCV 86.7 88.4 86.5  PLT 209 175 172   Cardiac Enzymes: No results found for this basename: CKTOTAL, CKMB, CKMBINDEX, TROPONINI,  in the last 168 hours BNP: BNP (last 3 results) No results found for this basename: PROBNP,  in the last 8760 hours CBG: No results found  for this basename: GLUCAP,  in the last 168 hours     Signed:  LAMA,GAGAN S  [facility] 12/14/2013, 4:23 PM

## 2013-12-14 NOTE — Progress Notes (Signed)
Patient discharged home, all discharge medications and instructions reviewed and questions answered. Patient to be assisted to vehicle by wheelchair.  

## 2013-12-14 NOTE — Progress Notes (Signed)
[facility] PROGRESS NOTE  Ross MeyerWillie C Powers ZOX:[id number] DOB: Oct 28, 1980 DOA: 12/11/2013 PCP: No primary provider on file.  Assessment/Plan: 1. Acute diverticulitis-  Improved, wbc is down to 14000, CT abdomen showed acute diverticulitis at the mid sigmoid colon, with focal soft tissue inflammation and colonic wall thickening, did not show perforation. Patient started on Cipro and Flagyl, has clinically improved. Pain is much better. Patient is hungry we'll start him on regular diet If tolerates will discharge home. 2. Nausea vomiting - secondary to above, continue Zofran when necessary 3. Tobacco abuse- continue nicotine patch 4. DVT prophylaxis- Lovenox  Code Status: Full code Family Communication: *Discussed with wife at bedside Disposition Plan: Home when stable   Consultants:  None  Procedures:  None  Antibiotics:  Cipro  Flagyl   HPI/Subjective: Patient seen and examined, admitted with acute sigmoid diverticulitis. Had one episode of vomiting last night. Wants to try regular food.  Objective: Filed Vitals:   12/14/13 0525  BP: 128/84  Pulse: 52  Temp: 98.5 F (36.9 C)  Resp: 16    Intake/Output Summary (Last 24 hours) at 12/14/13 1148 Last data filed at 12/14/13 [id number]  Gross per 24 hour  Intake 5026.66 ml  Output      0 ml  Net 5026.66 ml   Filed Weights   12/12/13 0625 12/14/13 0714  Weight: 98.93 kg (218 lb 1.6 oz) 100.29 kg (221 lb 1.6 oz)    Exam:  Physical Exam: Head: Normocephalic, atraumatic.        Neck: supple,No deformities, masses, or tenderness noted. Lungs: Normal respiratory effort. B/L Clear to auscultation, no crackles or wheezes.  Heart: Regular RR. S1 and S2 normal  Abdomen: BS normoactive. Soft, Nondistended, Mild tenderness to palpation in the left lower quadrant Extremities: No pretibial edema, no erythema   Data Reviewed: Basic Metabolic Panel:  Recent Labs Lab 12/12/13 0004 12/13/13 0456  NA 134* 136*  K  3.4* 3.7  CL 100 102  CO2 20 22  GLUCOSE 106* 127*  BUN 8 13  CREATININE 1.30 1.28  CALCIUM 8.7 8.3*   Liver Function Tests:  Recent Labs Lab 12/12/13 0004 12/13/13 0456  AST 29 36  ALT 31 37  ALKPHOS 80 74  BILITOT 0.5 0.4  PROT 6.7 6.0  ALBUMIN 3.4* 2.9*    Recent Labs Lab 12/12/13 0004  LIPASE 15   No results found for this basename: AMMONIA,  in the last 168 hours CBC:  Recent Labs Lab 12/12/13 0004 12/13/13 0456 12/14/13 0446  WBC 19.6* 17.3* 14.7*  NEUTROABS 18.7*  --   --   HGB 14.7 13.9 13.4  HCT 43.0 39.8 39.7  MCV 86.7 88.4 86.5  PLT 209 175 172   Cardiac Enzymes: No results found for this basename: CKTOTAL, CKMB, CKMBINDEX, TROPONINI,  in the last 168 hours BNP (last 3 results) No results found for this basename: PROBNP,  in the last 8760 hours CBG: No results found for this basename: GLUCAP,  in the last 168 hours  No results found for this or any previous visit (from the past 240 hour(s)).   Studies: No results found.  Scheduled Meds: . ciprofloxacin  500 mg Oral BID  . enoxaparin (LOVENOX) injection  40 mg Subcutaneous Q24H  . metroNIDAZOLE  500 mg Oral 3 times per day  . nicotine  7 mg Transdermal Daily  . pantoprazole  40 mg Oral Daily   Continuous Infusions: . sodium chloride 100 mL/hr at 12/14/13 [id number]    Principal  Problem:   Acute diverticulitis Active Problems:   Smoker    Time spent: 25 min    [facility] AntonioAMA,GAGAN S  [facility] Pager [phone] If 7PM-7AM, please contact night-coverage at [email or web address], password [email or web address] 12/14/2013, 11:48 AM  LOS: 3 days

## 2015-02-22 IMAGING — CT CT ABD-PELV W/ CM
1 of 5 series · 12 of 32 positions shown, 18 images · IV contrast (100 ML OMNI 300)
Comparison: CT of the abdomen and pelvis performed 06/25/2010

CLINICAL DATA: Upper and mid abdominal pain.  Leukocytosis.

EXAM:
CT ABDOMEN AND PELVIS WITH CONTRAST
TECHNIQUE: Multidetector CT imaging of the abdomen and pelvis was performed
using the standard protocol following bolus administration of
intravenous contrast.
CONTRAST:  100mL OMNIPAQUE IOHEXOL 300 MG/ML  SOLN

[Series 3: abd/pel with · axial · 0.78mm/px · z∈[+1264,+1644]mm · 12 of 90 slices shown, 18 images]
[im 7/90  soft-tissue]
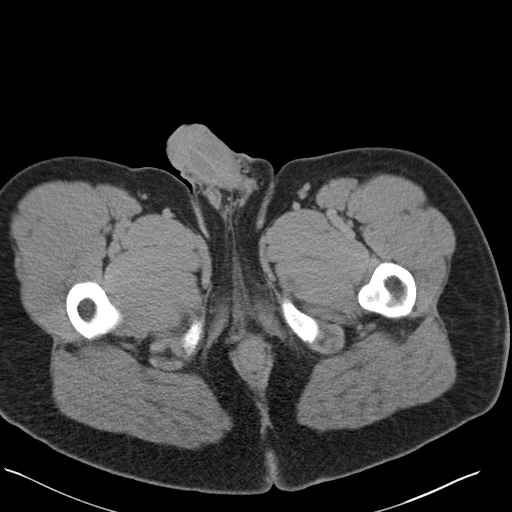
[im 7/90  bone]
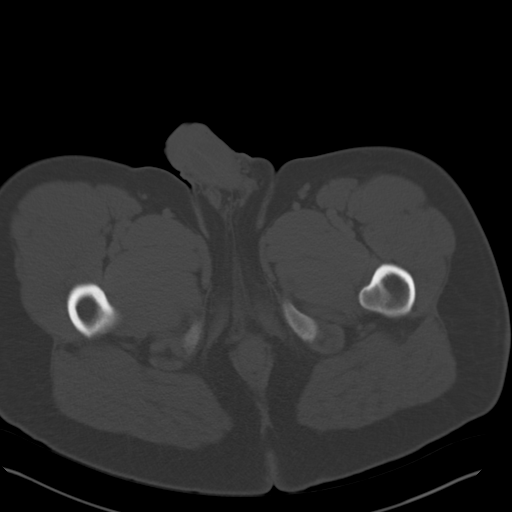
[im 14/90  soft-tissue]
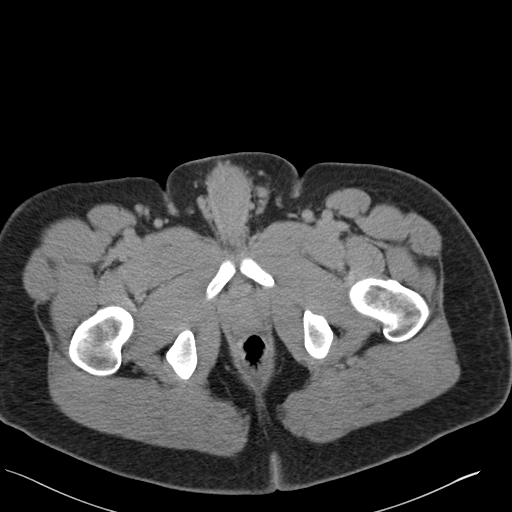
[im 21/90  soft-tissue]
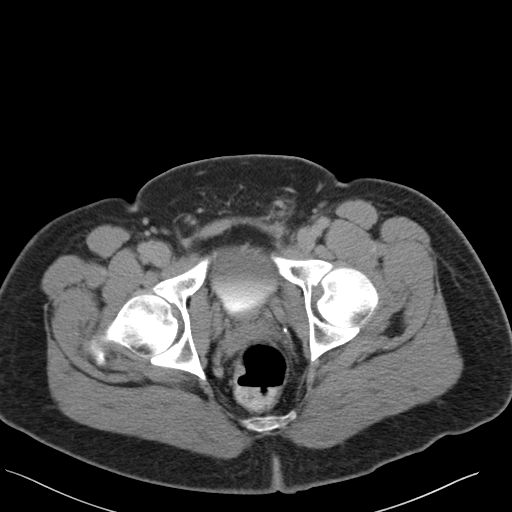
[im 28/90  soft-tissue]
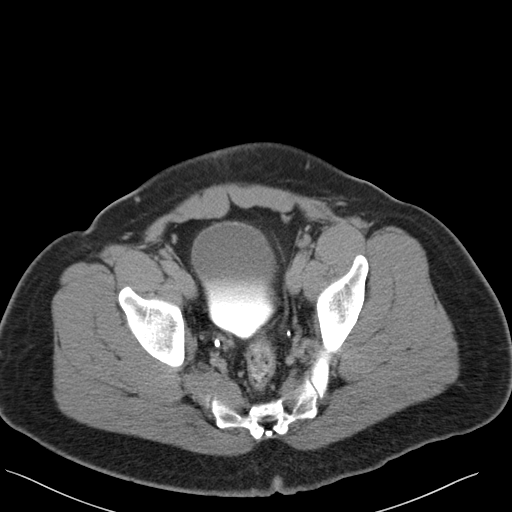
[im 35/90  soft-tissue]
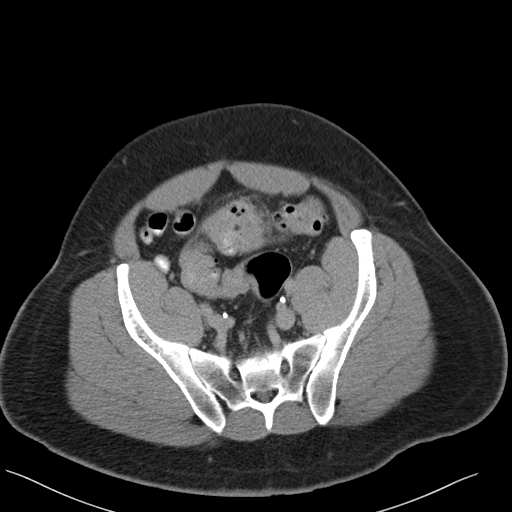
[im 42/90  soft-tissue]
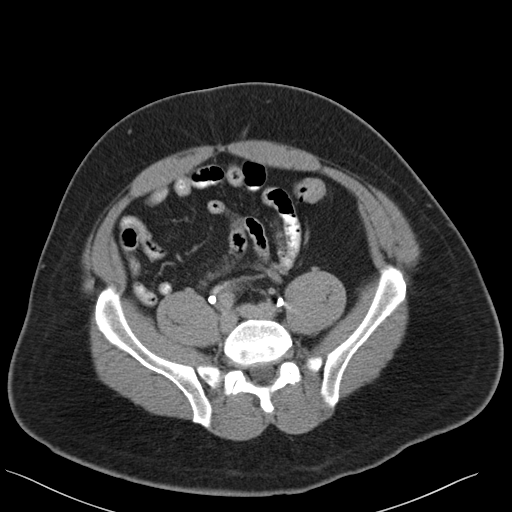
[im 48/90  soft-tissue]
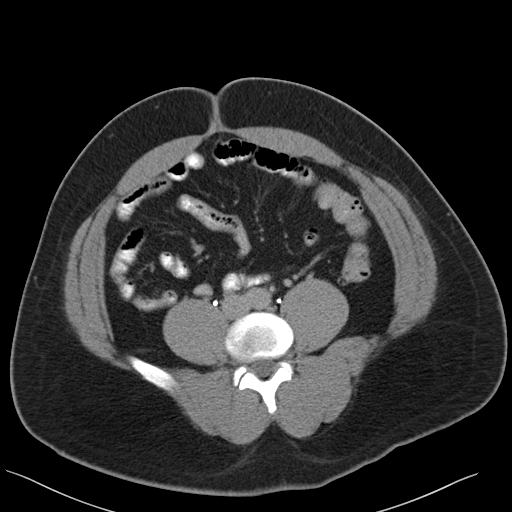
[im 55/90  soft-tissue]
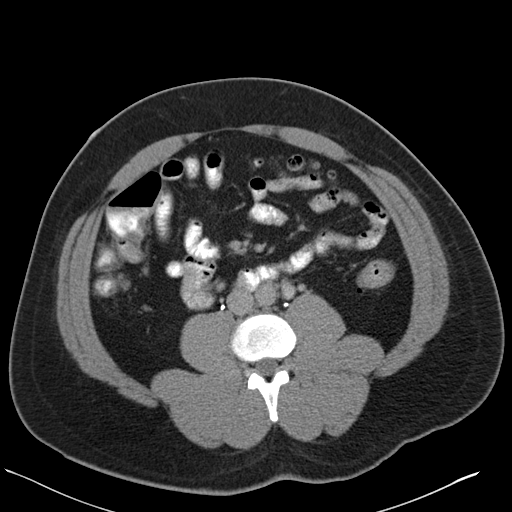
[im 62/90  soft-tissue]
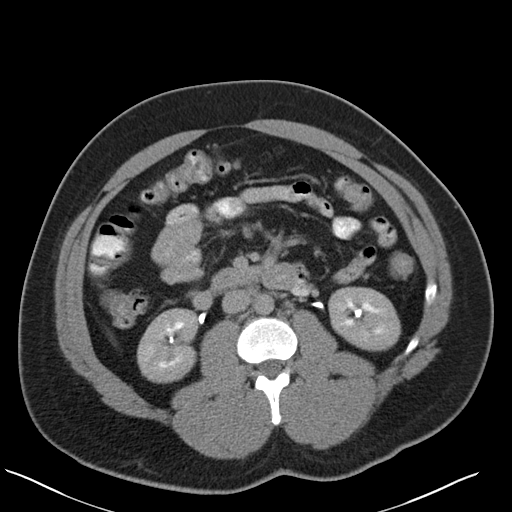
[im 62/90  lung]
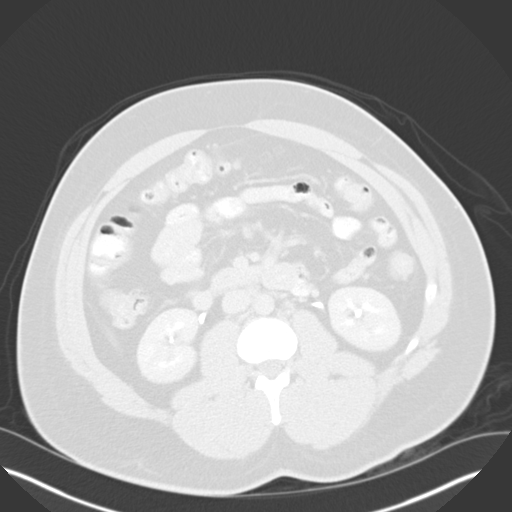
[im 62/90  bone]
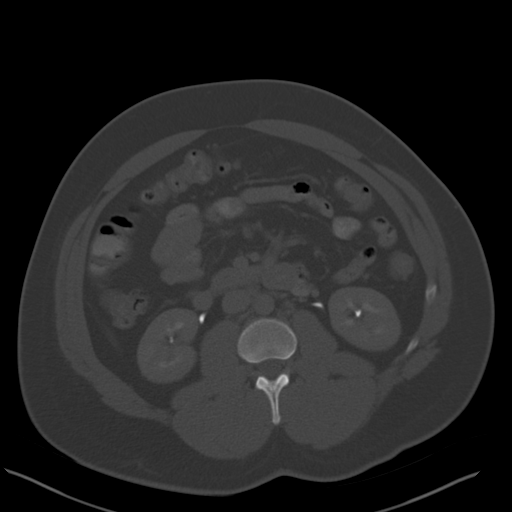
[im 69/90  soft-tissue]
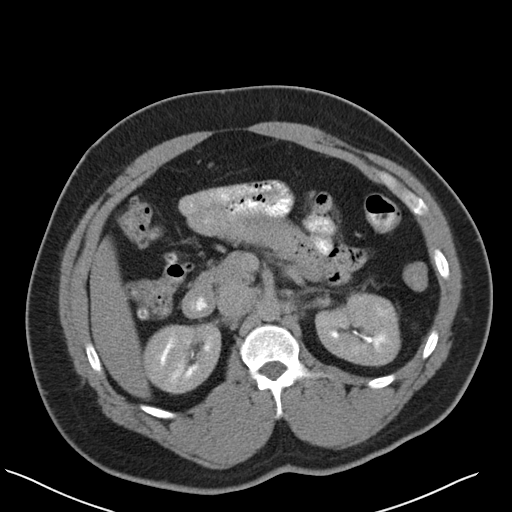
[im 69/90  lung]
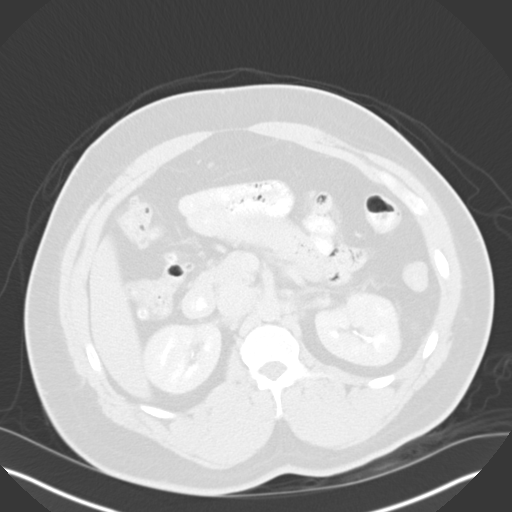
[im 76/90  soft-tissue]
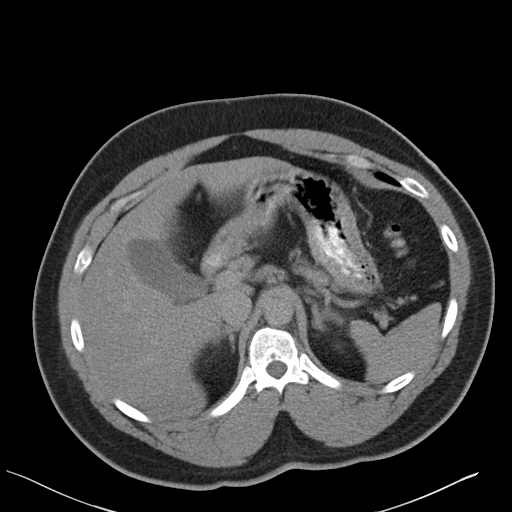
[im 76/90  lung]
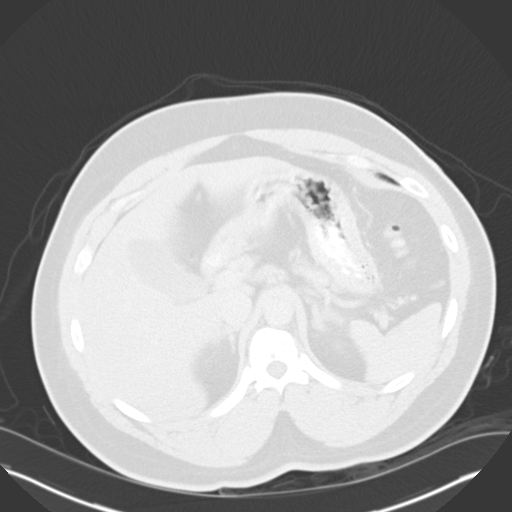
[im 83/90  soft-tissue]
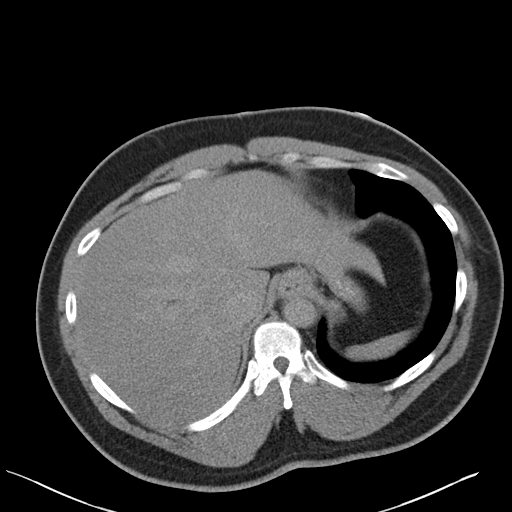
[im 83/90  lung]
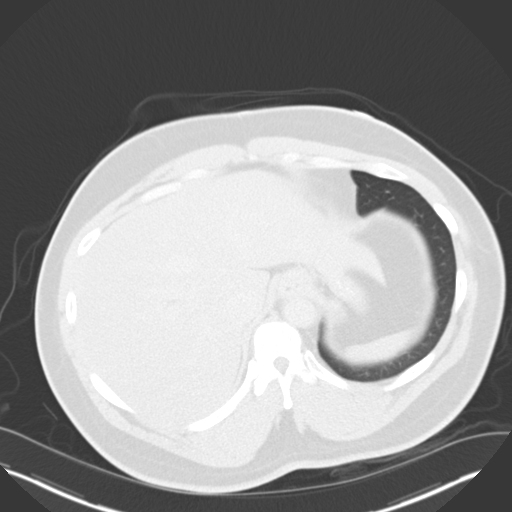

[12 of 32 positions shown; findings below may reference images not displayed]

FINDINGS: The visualized lung bases are clear.

The liver and spleen are unremarkable in appearance. The gallbladder
is within normal limits. The pancreas and adrenal glands are
unremarkable.

The kidneys are unremarkable in appearance. There is no evidence of
hydronephrosis. No renal or ureteral stones are seen. No perinephric
stranding is appreciated.

No free fluid is identified. The small bowel is unremarkable in
appearance. The stomach is within normal limits. No acute vascular
abnormalities are seen.

The patient is status post appendectomy. Minimal scattered
diverticulosis is noted along the entirety of the colon.

Focal soft tissue inflammation and colonic wall thickening is noted
at the mid sigmoid colon, with an inflamed diverticulum. Findings
are compatible with acute diverticulitis. There is no evidence of
significant perforation or abscess formation.

The bladder is mildly distended and grossly unremarkable in
appearance. The prostate is normal in size. No inguinal
lymphadenopathy is seen.

No acute osseous abnormalities are identified.
IMPRESSION: 1. Acute diverticulitis noted at the mid sigmoid colon, with focal
soft tissue inflammation and colonic wall thickening. No evidence of
significant perforation or abscess formation.
2. Minimal scattered diverticulosis along the entirety of the colon.
